# Patient Record
Sex: Female | Born: 1990 | Race: White | Hispanic: Yes | Smoking: Never smoker
Health system: Southern US, Community
[De-identification: ages and names within clinical notes are randomized; demographics above are authoritative.]

## PROBLEM LIST (undated history)

## (undated) HISTORY — PX: DILATION AND CURETTAGE OF UTERUS: SHX78

---

## 2019-09-10 DIAGNOSIS — Z419 Encounter for procedure for purposes other than remedying health state, unspecified: Secondary | ICD-10-CM | POA: Diagnosis not present

## 2019-10-10 DIAGNOSIS — Z419 Encounter for procedure for purposes other than remedying health state, unspecified: Secondary | ICD-10-CM | POA: Diagnosis not present

## 2019-10-14 DIAGNOSIS — Z3A09 9 weeks gestation of pregnancy: Secondary | ICD-10-CM | POA: Diagnosis not present

## 2019-10-14 DIAGNOSIS — A749 Chlamydial infection, unspecified: Secondary | ICD-10-CM | POA: Diagnosis not present

## 2019-10-14 DIAGNOSIS — O98811 Other maternal infectious and parasitic diseases complicating pregnancy, first trimester: Secondary | ICD-10-CM | POA: Diagnosis not present

## 2019-10-14 DIAGNOSIS — Z3689 Encounter for other specified antenatal screening: Secondary | ICD-10-CM | POA: Diagnosis not present

## 2019-10-14 DIAGNOSIS — Z3481 Encounter for supervision of other normal pregnancy, first trimester: Secondary | ICD-10-CM | POA: Diagnosis not present

## 2019-10-14 DIAGNOSIS — Z3491 Encounter for supervision of normal pregnancy, unspecified, first trimester: Secondary | ICD-10-CM | POA: Diagnosis not present

## 2019-10-22 DIAGNOSIS — Z3A08 8 weeks gestation of pregnancy: Secondary | ICD-10-CM | POA: Diagnosis not present

## 2019-10-22 DIAGNOSIS — O3680X0 Pregnancy with inconclusive fetal viability, not applicable or unspecified: Secondary | ICD-10-CM | POA: Diagnosis not present

## 2019-10-22 DIAGNOSIS — O021 Missed abortion: Secondary | ICD-10-CM | POA: Diagnosis not present

## 2019-10-24 DIAGNOSIS — O021 Missed abortion: Secondary | ICD-10-CM | POA: Diagnosis not present

## 2019-10-24 DIAGNOSIS — Z3A08 8 weeks gestation of pregnancy: Secondary | ICD-10-CM | POA: Diagnosis not present

## 2019-10-24 DIAGNOSIS — N854 Malposition of uterus: Secondary | ICD-10-CM | POA: Diagnosis not present

## 2019-11-10 DIAGNOSIS — Z419 Encounter for procedure for purposes other than remedying health state, unspecified: Secondary | ICD-10-CM | POA: Diagnosis not present

## 2019-12-10 DIAGNOSIS — Z419 Encounter for procedure for purposes other than remedying health state, unspecified: Secondary | ICD-10-CM | POA: Diagnosis not present

## 2020-01-07 DIAGNOSIS — Z3A00 Weeks of gestation of pregnancy not specified: Secondary | ICD-10-CM | POA: Diagnosis not present

## 2020-01-07 DIAGNOSIS — O200 Threatened abortion: Secondary | ICD-10-CM | POA: Diagnosis not present

## 2020-01-10 DIAGNOSIS — Z419 Encounter for procedure for purposes other than remedying health state, unspecified: Secondary | ICD-10-CM | POA: Diagnosis not present

## 2020-02-10 DIAGNOSIS — Z419 Encounter for procedure for purposes other than remedying health state, unspecified: Secondary | ICD-10-CM | POA: Diagnosis not present

## 2020-03-09 DIAGNOSIS — Z419 Encounter for procedure for purposes other than remedying health state, unspecified: Secondary | ICD-10-CM | POA: Diagnosis not present

## 2020-04-09 DIAGNOSIS — Z419 Encounter for procedure for purposes other than remedying health state, unspecified: Secondary | ICD-10-CM | POA: Diagnosis not present

## 2020-04-24 ENCOUNTER — Other Ambulatory Visit: Payer: Self-pay

## 2020-04-24 ENCOUNTER — Encounter (HOSPITAL_COMMUNITY): Payer: Self-pay | Admitting: Emergency Medicine

## 2020-04-24 ENCOUNTER — Inpatient Hospital Stay (HOSPITAL_COMMUNITY): Payer: Medicaid Other

## 2020-04-24 ENCOUNTER — Inpatient Hospital Stay (HOSPITAL_COMMUNITY)
Admission: EM | Admit: 2020-04-24 | Discharge: 2020-04-24 | Disposition: A | Discharge status: Home / Self Care | Payer: Medicaid Other | Attending: Obstetrics & Gynecology | Admitting: Obstetrics & Gynecology

## 2020-04-24 DIAGNOSIS — K5900 Constipation, unspecified: Secondary | ICD-10-CM | POA: Insufficient documentation

## 2020-04-24 DIAGNOSIS — R1011 Right upper quadrant pain: Secondary | ICD-10-CM | POA: Insufficient documentation

## 2020-04-24 DIAGNOSIS — O99611 Diseases of the digestive system complicating pregnancy, first trimester: Secondary | ICD-10-CM | POA: Insufficient documentation

## 2020-04-24 DIAGNOSIS — O26891 Other specified pregnancy related conditions, first trimester: Secondary | ICD-10-CM | POA: Insufficient documentation

## 2020-04-24 DIAGNOSIS — Z3A14 14 weeks gestation of pregnancy: Secondary | ICD-10-CM | POA: Diagnosis not present

## 2020-04-24 DIAGNOSIS — O99612 Diseases of the digestive system complicating pregnancy, second trimester: Secondary | ICD-10-CM | POA: Diagnosis not present

## 2020-04-24 DIAGNOSIS — O26892 Other specified pregnancy related conditions, second trimester: Secondary | ICD-10-CM

## 2020-04-24 DIAGNOSIS — O26899 Other specified pregnancy related conditions, unspecified trimester: Secondary | ICD-10-CM

## 2020-04-24 DIAGNOSIS — R109 Unspecified abdominal pain: Secondary | ICD-10-CM

## 2020-04-24 DIAGNOSIS — Z3A13 13 weeks gestation of pregnancy: Secondary | ICD-10-CM | POA: Insufficient documentation

## 2020-04-24 LAB — URINALYSIS, ROUTINE W REFLEX MICROSCOPIC
Bilirubin Urine: NEGATIVE
Glucose, UA: NEGATIVE mg/dL
Hgb urine dipstick: NEGATIVE
Ketones, ur: NEGATIVE mg/dL
Nitrite: NEGATIVE
Protein, ur: NEGATIVE mg/dL
Specific Gravity, Urine: 1.016 (ref 1.005–1.030)
pH: 5 (ref 5.0–8.0)

## 2020-04-24 LAB — CBC WITH DIFFERENTIAL/PLATELET
Abs Immature Granulocytes: 0.02 10*3/uL (ref 0.00–0.07)
Basophils Absolute: 0 10*3/uL (ref 0.0–0.1)
Basophils Relative: 0 %
Eosinophils Absolute: 0.1 10*3/uL (ref 0.0–0.5)
Eosinophils Relative: 2 %
HCT: 36.3 % (ref 36.0–46.0)
Hemoglobin: 12 g/dL (ref 12.0–15.0)
Immature Granulocytes: 0 %
Lymphocytes Relative: 29 %
Lymphs Abs: 1.8 10*3/uL (ref 0.7–4.0)
MCH: 30.1 pg (ref 26.0–34.0)
MCHC: 33.1 g/dL (ref 30.0–36.0)
MCV: 91 fL (ref 80.0–100.0)
Monocytes Absolute: 0.3 10*3/uL (ref 0.1–1.0)
Monocytes Relative: 5 %
Neutro Abs: 3.9 10*3/uL (ref 1.7–7.7)
Neutrophils Relative %: 64 %
Platelets: 215 10*3/uL (ref 150–400)
RBC: 3.99 MIL/uL (ref 3.87–5.11)
RDW: 13.7 % (ref 11.5–15.5)
WBC: 6.2 10*3/uL (ref 4.0–10.5)
nRBC: 0 % (ref 0.0–0.2)

## 2020-04-24 LAB — WET PREP, GENITAL
Clue Cells Wet Prep HPF POC: NONE SEEN
Sperm: NONE SEEN
Trich, Wet Prep: NONE SEEN
Yeast Wet Prep HPF POC: NONE SEEN

## 2020-04-24 LAB — HCG, QUANTITATIVE, PREGNANCY: hCG, Beta Chain, Quant, S: 52816 m[IU]/mL — ABNORMAL HIGH (ref <5)

## 2020-04-24 LAB — COMPREHENSIVE METABOLIC PANEL
ALT: 12 U/L (ref 0–44)
AST: 17 U/L (ref 15–41)
Albumin: 3.1 g/dL — ABNORMAL LOW (ref 3.5–5.0)
Alkaline Phosphatase: 38 U/L (ref 38–126)
Anion gap: 12 (ref 5–15)
BUN: 6 mg/dL (ref 6–20)
CO2: 20 mmol/L — ABNORMAL LOW (ref 22–32)
Calcium: 9.4 mg/dL (ref 8.9–10.3)
Chloride: 104 mmol/L (ref 98–111)
Creatinine, Ser: 0.62 mg/dL (ref 0.44–1.00)
GFR, Estimated: >60 mL/min (ref >60)
Glucose, Bld: 102 mg/dL — ABNORMAL HIGH (ref 70–99)
Potassium: 3.6 mmol/L (ref 3.5–5.1)
Sodium: 136 mmol/L (ref 135–145)
Total Bilirubin: 0.2 mg/dL — ABNORMAL LOW (ref 0.3–1.2)
Total Protein: 7.2 g/dL (ref 6.5–8.1)

## 2020-04-24 LAB — HCG, SERUM, QUALITATIVE: Preg, Serum: POSITIVE — AB

## 2020-04-24 LAB — LIPASE, BLOOD: Lipase: 38 U/L (ref 11–51)

## 2020-04-24 MED ORDER — DOCUSATE SODIUM 100 MG PO CAPS: 100.0000 mg | ORAL_CAPSULE | Freq: Two times a day (BID) | ORAL | 0 refills | Status: AC

## 2020-04-24 MED ORDER — CYCLOBENZAPRINE HCL 5 MG PO TABS
10.0000 mg | ORAL_TABLET | Freq: Once | ORAL | Status: AC
  Administered 2020-04-24: 10 mg via ORAL
  Filled 2020-04-24: qty 2

## 2020-04-24 MED ORDER — ACETAMINOPHEN 500 MG PO TABS
1000.0000 mg | ORAL_TABLET | Freq: Once | ORAL | Status: AC
  Administered 2020-04-24: 1000 mg via ORAL
  Filled 2020-04-24: qty 2

## 2020-04-24 MED ORDER — POLYETHYLENE GLYCOL 3350 17 G PO PACK: 17.0000 g | PACK | Freq: Every day | ORAL | 0 refills | Status: AC

## 2020-04-24 NOTE — ED Triage Notes (Signed)
Patient reports RUQ abdominal pain last night , she is [redacted] weeks pregnant G3P2 , no prenatal care , denies vaginal bleeding .

## 2020-04-24 NOTE — MAU Provider Note (Signed)
History     CSN: [id number]  Arrival date and time: 04/24/20 0234   Event Date/Time   First Provider Initiated Contact with Patient 04/24/20 [phone]      Chief Complaint  Patient presents with  . RUQ Abdominal Pain / 14 weeks G3P2  . Abdominal Pain   Joanna Pratt is a 30 y.o. [id number] at [redacted]w[redacted]d who receives care in [place].  She presents today for Abdominal Pain.  She states her pain started Thursday and "is uncomfortable."  Patient states she can not recall the last time she had a bowel movement.  She reports she takes Zofran every 8 hours for nausea and last dose was "yesterday around 6pm." She states the pain is "on and off" and has no relieving factors, but is worsened "when I go to the bathroom and can not poop." She rates the pain a 5/10 and has taken tylenol with some relief around 9pm.     OB History    Gravida  4   Para  2   Term  1   Preterm  1   AB  1   Living  2     SAB  1   IAB      Ectopic      Multiple      Live Births  2           History reviewed. No pertinent past medical history.  Past Surgical History:  Procedure Laterality Date  . DILATION AND CURETTAGE OF UTERUS      History reviewed. No pertinent family history.  Social History   Tobacco Use  . Smoking status: Never Smoker  . Smokeless tobacco: Never Used  Substance Use Topics  . Alcohol use: Never  . Drug use: Never    Allergies: No Known Allergies  Medications Prior to Admission  Medication Sig Dispense Refill Last Dose  . ondansetron (ZOFRAN) 4 MG tablet Take 4 mg by mouth every 8 (eight) hours as needed for nausea or vomiting.   04/23/2020 at Unknown time    Review of Systems  Constitutional: Negative for chills and fever.  Eyes: Negative for visual disturbance.  Respiratory: Negative for cough and shortness of breath.   Gastrointestinal: Positive for abdominal pain, constipation and nausea. Negative for vomiting.  Genitourinary: Negative for difficulty  urinating, dysuria, vaginal bleeding and vaginal discharge.  Musculoskeletal: Negative for back pain.  Neurological: Negative for dizziness, light-headedness and headaches.   Physical Exam   Blood pressure 105/73, pulse 91, temperature 98.5 F (36.9 C), temperature source Oral, resp. rate 18, height 5\' 4"  (1.626 m), weight 77.2 kg, last menstrual period 01/11/2020, SpO2 100 %.  Physical Exam Vitals reviewed.  Constitutional:      Appearance: She is well-developed.  HENT:     Head: Normocephalic and atraumatic.  Eyes:     Conjunctiva/sclera: Conjunctivae normal.  Cardiovascular:     Rate and Rhythm: Normal rate and regular rhythm.     Heart sounds: Normal heart sounds.  Pulmonary:     Effort: Pulmonary effort is normal. No respiratory distress.     Breath sounds: Normal breath sounds.  Abdominal:     Palpations: Abdomen is soft.  Musculoskeletal:        General: Normal range of motion.     Cervical back: Normal range of motion.     Right lower leg: No edema.     Left lower leg: No edema.  Skin:    General: Skin is warm and dry.  Neurological:     Mental Status: She is alert and oriented to person, place, and time.  Psychiatric:        Mood and Affect: Mood normal.        Behavior: Behavior normal.     MAU Course  Procedures   Results for orders placed or performed during the hospital encounter of 04/24/20 (from the past 24 hour(s))  hCG, quantitative, pregnancy     Status: Abnormal   Collection Time: 04/24/20  3:30 AM  Result Value Ref Range   hCG, Beta Chain, Quant, S 52,816 (H) <5 mIU/mL  CBC with Differential/Platelet     Status: None   Collection Time: 04/24/20  3:38 AM  Result Value Ref Range   WBC 6.2 4.0 - 10.5 K/uL   RBC 3.99 3.87 - 5.11 MIL/uL   Hemoglobin 12.0 12.0 - 15.0 g/dL   HCT 60.4 54.0 - 98.1 %   MCV 91.0 80.0 - 100.0 fL   MCH 30.1 26.0 - 34.0 pg   MCHC 33.1 30.0 - 36.0 g/dL   RDW 19.1 47.8 - 29.5 %   Platelets 215 150 - 400 K/uL   nRBC 0.0  0.0 - 0.2 %   Neutrophils Relative % 64 %   Neutro Abs 3.9 1.7 - 7.7 K/uL   Lymphocytes Relative 29 %   Lymphs Abs 1.8 0.7 - 4.0 K/uL   Monocytes Relative 5 %   Monocytes Absolute 0.3 0.1 - 1.0 K/uL   Eosinophils Relative 2 %   Eosinophils Absolute 0.1 0.0 - 0.5 K/uL   Basophils Relative 0 %   Basophils Absolute 0.0 0.0 - 0.1 K/uL   Immature Granulocytes 0 %   Abs Immature Granulocytes 0.02 0.00 - 0.07 K/uL  Comprehensive metabolic panel     Status: Abnormal   Collection Time: 04/24/20  3:38 AM  Result Value Ref Range   Sodium 136 135 - 145 mmol/L   Potassium 3.6 3.5 - 5.1 mmol/L   Chloride 104 98 - 111 mmol/L   CO2 20 (L) 22 - 32 mmol/L   Glucose, Bld 102 (H) 70 - 99 mg/dL   BUN 6 6 - 20 mg/dL   Creatinine, Ser 6.21 0.44 - 1.00 mg/dL   Calcium 9.4 8.9 - 30.8 mg/dL   Total Protein 7.2 6.5 - 8.1 g/dL   Albumin 3.1 (L) 3.5 - 5.0 g/dL   AST 17 15 - 41 U/L   ALT 12 0 - 44 U/L   Alkaline Phosphatase 38 38 - 126 U/L   Total Bilirubin 0.2 (L) 0.3 - 1.2 mg/dL   GFR, Estimated >65 >78 mL/min   Anion gap 12 5 - 15  Lipase, blood     Status: None   Collection Time: 04/24/20  3:38 AM  Result Value Ref Range   Lipase 38 11 - 51 U/L  hCG, serum, qualitative     Status: Abnormal   Collection Time: 04/24/20  4:20 AM  Result Value Ref Range   Preg, Serum POSITIVE (A) NEGATIVE  Urinalysis, Routine w reflex microscopic Urine, Clean Catch     Status: Abnormal   Collection Time: 04/24/20  5:21 AM  Result Value Ref Range   Color, Urine YELLOW YELLOW   APPearance HAZY (A) CLEAR   Specific Gravity, Urine 1.016 1.005 - 1.030   pH 5.0 5.0 - 8.0   Glucose, UA NEGATIVE NEGATIVE mg/dL   Hgb urine dipstick NEGATIVE NEGATIVE   Bilirubin Urine NEGATIVE NEGATIVE   Ketones, ur NEGATIVE NEGATIVE mg/dL  Protein, ur NEGATIVE NEGATIVE mg/dL   Nitrite NEGATIVE NEGATIVE   Leukocytes,Ua TRACE (A) NEGATIVE   RBC / HPF 0-5 0 - 5 RBC/hpf   WBC, UA 11-20 0 - 5 WBC/hpf   Bacteria, UA FEW (A) NONE SEEN    Squamous Epithelial / LPF 0-5 0 - 5   Mucus PRESENT    US OB Comp Less 14 Wks  Result Date: 04/24/2020 CLINICAL DATA:  Abdominal pain for 2 days EXAM: OBSTETRIC <14 WK ULTRASOUND TECHNIQUE: Transabdominal ultrasound was performed for evaluation of the gestation as well as the maternal uterus and adnexal regions. COMPARISON:  None. FINDINGS: Intrauterine gestational sac: Single Yolk sac:  Not Visualized. Embryo:  Visualized. Cardiac Activity: Visualized. Heart Rate: 167 bpm CRL:   72.9 mm   13 w 3 d                  [place] EDC: 10/27/2020 Subchorionic hemorrhage:  None visualized. Maternal uterus/adnexae: Gravid maternal uterus without acute or worrisome abnormality. Normal appearance of the ovaries. No free pelvic fluid. IMPRESSION: Single viable intrauterine gestation at 13 weeks, 3 days by crown-rump length sonographic estimation. No acute or worrisome sonographic complication. Nonvisualization of the yolk sac is a normal physiologic finding after 10 weeks. Electronically Signed   By: Kreg Shropshire M.D.   On: 04/24/2020 06:43    MDM Labs: UA, Wet Prep, UC Results of CBC, CMP, and hCG reviewed from ED blood draw Ultrasound Analgesic Muscle Relaxant Assessment and Plan  30 year old  [id number] at 14.6 weeks Abdominal Pain Constipation  -POC Reviewed. -Patient declines GC/CT testing stating she had one recently at her primary ob.  Patient is agreeable to wet prep. -Patient also declines Enema stating she has never had one before. -Provider reiterates benefits of enema and patient declines stating she would rather do home treatment.  -Will send for [place] to confirm SIUP. -Discussed management of constipation at home with increased water and fiber intake as well as stool softener/laxative.  -Instructed to decrease usage of Zofran to reduce constipation.  -Patient offered and accepts pain medication. -Will give flexeril and muscle relaxant. -Will await results.   Cherre Robins 04/24/2020, 5:49 AM    Reassessment (6:49 AM) SIUP at 13.6 weeks  -Wet prep returns negative -[place] results as above. -Provider to bedside to discuss. -Informed that will not change EDD. -Discussed usage of laxative and stool softener. -Rx for miralax and colace given. -Patient reports pain has improved -Patient to follow up with primary ob as scheduled. -Encouraged to call primary ob or return to MAU if symptoms worsen or with the onset of new symptoms. -Discharged to home in stable condition.  Cherre Robins MSN, CNM Advanced Practice Provider, [facility]

## 2020-04-24 NOTE — Discharge Instructions (Signed)
Fiber Content in Foods Fiber is a substance that is found in plant foods, such as fruits, vegetables, whole grains, nuts, seeds, and beans. As part of your treatment and recovery plan, your health care provider may recommend that you eat foods that have specific amounts of dietary fiber. Some conditions may require a high-fiber diet while others may require a low-fiber diet. This sheet gives you information about the dietary fiber content of some common foods. Your health care provider will tell you how much fiber you need in your diet. If you have problems or questions, contact your health care provider or dietitian. What foods are high in fiber? Fruits  Blackberries or raspberries (fresh) --  cup (75 g) has 4 g of fiber.  Pear (fresh) -- 1 medium (180 g) has 5.5 g of fiber.  Prunes (dried) -- 6 to 8 pieces (57-76 g) has 5 g of fiber.  Apple with skin -- 1 medium (182 g) has 4.8 g of fiber.  Guava -- 1 cup (128 g) has 8.9 g of fiber. Vegetables  Peas (frozen) --  cup (80 g) has 4.4 g of fiber.  Potato with skin (baked) -- 1 medium (173 g) has 4.4 g of fiber.  Pumpkin (canned) --  cup (122 g) has 5 g of fiber.  Brussels sprouts (cooked) --  cup (78 g) has 4 g of fiber.  Sweet potato --  cup mashed (124 g) has 4 g of fiber.  Winter squash -- 1 cup cooked (205 g) has 5.7 g of fiber. Grains  Bran cereal --  cup (31 g) has 8.6 g of fiber.  Bulgur (cooked) --  cup (70 g) has 4 g of fiber.  Quinoa (cooked) -- 1 cup (185 g) has 5.2 g of fiber.  Popcorn -- 3 cups (375 g) popped has 5.8 g of fiber.  Spaghetti, whole wheat -- 1 cup (140 g) has 6 g of fiber. Meats and other proteins  Pinto beans (cooked) --  cup (90 g) has 7.7 g of fiber.  Lentils (cooked) --  cup (90 g) has 7.8 g of fiber.  Kidney beans (canned) --  cup (92.5 g) has 5.7 g of fiber.  Soybeans (canned, frozen, or fresh) --  cup (92.5 g) has 5.2 g of fiber.  Baked beans, plain or vegetarian (canned) --   cup (130 g) has 5.2 g of fiber.  Garbanzo beans or chickpeas (canned) --  cup (90 g) has 6.6 g of fiber.  Black beans (cooked) --  cup (86 g) has 7.5 g of fiber.  White beans or navy beans (cooked) --  cup (91 g) has 9.3 g of fiber. The items listed above may not be a complete list of foods with high fiber. Actual amounts of fiber may be different depending on processing. Contact a dietitian for more information.   What foods are moderate in fiber? Fruits  Banana -- 1 medium (126 g) has 3.2 g of fiber.  Melon -- 1 cup (155 g) has 1.4 g of fiber.  Orange -- 1 small (154 g) has 3.7 g of fiber.  Raisins --  cup (40 g) has 1.8 g of fiber.  Applesauce, sweetened --  cup (125 g) has 1.5 g of fiber.  Blueberries (fresh) --  cup (75 g) has 1.8 g of fiber.  Strawberries (fresh, sliced) -- 1 cup (150 g) has 3 g of fiber.  Cherries -- 1 cup (140 g) has 2.9 g of fiber. Vegetables    Broccoli (cooked) --  cup (77.5 g) has 2.1 g of fiber.  Carrots (cooked) --  cup (77.5 g) has 2.2 g of fiber.  Corn (canned or frozen) --  cup (82.5 g) has 2.1 g of fiber.  Potatoes, mashed --  cup (105 g) has 1.6 g of fiber.  Tomato -- 1 medium (62 g) has 1.5 g of fiber.  Green beans (canned) --  cup (83 g) has 2 g of fiber.  Squash, winter --  cup (58 g) has 1 g of fiber.  Sweet potato, baked -- 1 medium (150 g) has 3 g of fiber.  Cauliflower (cooked) -- 1/2 cup (90 g) has 2.3 g of fiber. Grains  Long-grain brown rice (cooked) -- 1 cup (196 g) has 3.5 g of fiber.  Bagel, plain -- one 4-inch (10 cm) bagel has 2 g of fiber.  Instant oatmeal --  cup (120 g) has about 2 g of fiber.  Macaroni noodles, enriched (cooked) -- 1 cup (140 g) has 2.5 g of fiber.  Multigrain cereal --  cup (15 g) has about 2-4 g of fiber.  Whole-wheat bread -- 1 slice (26 g) has 2 g of fiber.  Whole-wheat spaghetti noodles --  cup (70 g) has 3.2 g of fiber.  Corn tortilla -- one 6-inch (15 cm) tortilla  has 1.5 g of fiber. Meats and other proteins  Almonds --  cup or 1 oz (28 g) has 3.5 g of fiber.  Sunflower seeds in shell --  cup or  oz (11.5 g) has 1.1 g of fiber.  Vegetable or soy patty -- 1 patty (70 g) has 3.4 g of fiber.  Walnuts --  cup or 1 oz (30 g) has 2 g of fiber.  Flax seed -- 1 Tbsp (7 g) has 2.8 g of fiber. The items listed above may not be a complete list of foods that have moderate amounts of fiber. Actual amounts of fiber may be different depending on processing. Contact a dietitian for more information.   What foods are low in fiber? Low-fiber foods contain less than 1 g of fiber per serving. They include: Fruits  Fruit juice --  cup or 4 fl oz (118 mL) has 0.5 g of fiber. Vegetables  Lettuce -- 1 cup (35 g) has 0.5 g of fiber.  Cucumber (slices) --  cup (60 g) has 0.3 g of fiber.  Celery -- 1 stalk (40 g) has 0.1 g of fiber. Grains  Flour tortilla -- one 6-inch (15 cm) tortilla has 0.5 g of fiber.  White rice (cooked) --  cup (81.5 g) has 0.3 g of fiber. Meats and other proteins  Egg -- 1 large (50 g) has 0 g of fiber.  Meat, poultry, or fish -- 3 oz (85 g) has 0 g of fiber. Dairy  Milk -- 1 cup or 8 fl oz (237 mL) has 0 g of fiber.  Yogurt -- 1 cup (245 g) has 0 g of fiber. The items listed above may not be a complete list of foods that are low in fiber. Actual amounts of fiber may be different depending on processing. Contact a dietitian for more information.   Summary  Fiber is a substance that is found in plant foods, such as fruits, vegetables, whole grains, nuts, seeds, and beans.  As part of your treatment and recovery plan, your health care provider may recommend that you eat foods that have specific amounts of dietary fiber. This information is   not intended to replace advice given to you by your health care provider. Make sure you discuss any questions you have with your health care provider. Document Revised: 05/01/2019 Document  Reviewed: 05/01/2019 Elsevier Patient Education  2021 Elsevier Inc. Constipation, Adult Constipation is when a person has fewer than three bowel movements in a week, has difficulty having a bowel movement, or has stools (feces) that are dry, hard, or larger than normal. Constipation may be caused by an underlying condition. It may become worse with age if a person takes certain medicines and does not take in enough fluids. Follow these instructions at home: Eating and drinking  Eat foods that have a lot of fiber, such as beans, whole grains, and fresh fruits and vegetables.  Limit foods that are low in fiber and high in fat and processed sugars, such as fried or sweet foods. These include french fries, hamburgers, cookies, candies, and soda.  Drink enough fluid to keep your urine pale yellow.   General instructions  Exercise regularly or as told by your health care provider. Try to do 150 minutes of moderate exercise each week.  Use the bathroom when you have the urge to go. Do not hold it in.  Take over-the-counter and prescription medicines only as told by your health care provider. This includes any fiber supplements.  During bowel movements: ? Practice deep breathing while relaxing the lower abdomen. ? Practice pelvic floor relaxation.  Watch your condition for any changes. Let your health care provider know about them.  Keep all follow-up visits as told by your health care provider. This is important. Contact a health care provider if:  You have pain that gets worse.  You have a fever.  You do not have a bowel movement after 4 days.  You vomit.  You are not hungry or you lose weight.  You are bleeding from the opening between the buttocks (anus).  You have thin, pencil-like stools. Get help right away if:  You have a fever and your symptoms suddenly get worse.  You leak stool or have blood in your stool.  Your abdomen is bloated.  You have severe pain in your  abdomen.  You feel dizzy or you faint. Summary  Constipation is when a person has fewer than three bowel movements in a week, has difficulty having a bowel movement, or has stools (feces) that are dry, hard, or larger than normal.  Eat foods that have a lot of fiber, such as beans, whole grains, and fresh fruits and vegetables.  Drink enough fluid to keep your urine pale yellow.  Take over-the-counter and prescription medicines only as told by your health care provider. This includes any fiber supplements. This information is not intended to replace advice given to you by your health care provider. Make sure you discuss any questions you have with your health care provider. Document Revised: 11/13/2018 Document Reviewed: 11/13/2018 Elsevier Patient Education  2021 [employer].

## 2020-04-24 NOTE — ED Provider Notes (Signed)
[name] EMERGENCY DEPARTMENT Provider Note   CSN: [id number] Arrival date & time: 04/24/20  0234     History Chief Complaint  Patient presents with  . RUQ Abdominal Pain / 14 weeks G3P2    Joanna Pratt is a 30 y.o. female.  Patient here with abdominal pain in pregnancy.  She states that the pain started last night.  She reports that she is about [redacted] weeks along.  She hasn't had any prenatal care.  Denies any vaginal discharge or bleeding.  States that she can't feel the baby moving.  Reports some cloudy urine.  Denies any other associate symptoms.  The history is provided by the patient. No language interpreter was used.       History reviewed. No pertinent past medical history.  There are no problems to display for this patient.   History reviewed. No pertinent surgical history.   OB History    Gravida  1   Para      Term      Preterm      AB      Living        SAB      IAB      Ectopic      Multiple      Live Births              No family history on file.  Social History   Tobacco Use  . Smoking status: Never Smoker  . Smokeless tobacco: Never Used  Substance Use Topics  . Alcohol use: Never  . Drug use: Never    Home Medications Prior to Admission medications   Not on File    Allergies    Patient has no known allergies.  Review of Systems   Review of Systems  All other systems reviewed and are negative.   Physical Exam Updated Vital Signs Ht 5\' 4"  (1.626 m)   LMP 01/11/2020   Physical Exam Vitals and nursing note reviewed.  Constitutional:      General: She is not in acute distress.    Appearance: She is well-developed.  HENT:     Head: Normocephalic and atraumatic.  Eyes:     Conjunctiva/sclera: Conjunctivae normal.  Cardiovascular:     Rate and Rhythm: Normal rate.     Heart sounds: No murmur heard.   Pulmonary:     Effort: Pulmonary effort is normal. No respiratory distress.   Abdominal:     General: There is no distension.  Musculoskeletal:     Cervical back: Neck supple.     Comments: Moves all extremities  Skin:    General: Skin is warm and dry.  Neurological:     Mental Status: She is alert and oriented to person, place, and time.  Psychiatric:        Mood and Affect: Mood normal.        Behavior: Behavior normal.     ED Results / Procedures / Treatments   Labs (all labs ordered are listed, but only abnormal results are displayed) Labs Reviewed - No data to display  EKG None  Radiology No results found.  Procedures Procedures   Medications Ordered in ED Medications - No data to display  ED Course  I have reviewed the triage vital signs and the nursing notes.  Pertinent labs & imaging results that were available during my care of the patient were reviewed by me and considered in my medical decision making (see chart for  details).    MDM Rules/Calculators/A&P                          G3P2 patient with abdominal cramps and decreased fetal activity.  Denies vaginal bleeding or discharge.  Cramping started last night.  Patient discussed with MAU APP, who accepts in transfer. Final Clinical Impression(s) / ED Diagnoses Final diagnoses:  Abdominal pain during pregnancy in first trimester    Rx / DC Orders ED Discharge Orders    None       Roxy Horseman, PA-C 04/24/20 0454    Shon Baton, MD 04/25/20 0005

## 2020-04-24 NOTE — MAU Note (Signed)
PT SAYS SHE WENT TO [facility] AT 0300 FOR ABD PAIN  PAIN STARTED Thursday NIGHT  [facility] IN [place]-

## 2020-04-25 LAB — URINE CULTURE: Culture: >=100000 — AB

## 2020-04-27 ENCOUNTER — Telehealth: Payer: Self-pay | Admitting: Student

## 2020-04-27 NOTE — Telephone Encounter (Signed)
Attempted to contact patient regarding urine culture results. This is the second attempt. Voicemail box is full. She needs to be treated for UTI. No pharmacy on file.   Judeth Horn, NP

## 2020-04-28 ENCOUNTER — Telehealth: Payer: Self-pay | Admitting: Obstetrics and Gynecology

## 2020-04-28 DIAGNOSIS — O2342 Unspecified infection of urinary tract in pregnancy, second trimester: Secondary | ICD-10-CM

## 2020-04-28 MED ORDER — CEFADROXIL 500 MG PO CAPS: 500.0000 mg | ORAL_CAPSULE | Freq: Two times a day (BID) | ORAL | 0 refills | Status: AC

## 2020-04-28 NOTE — Telephone Encounter (Signed)
TC to patient, ID verified by DOB and name, notified of (+) UCx results and need for abx. Pharmacy not on file -- desires Rx to be sent to [facility] in [place], [place] -- Rx sent. Advised to take Rx completely, even if not feeling symptomatic. Patient verbalized an understanding of the plan of care and agrees.   Raelyn Mora, CNM

## 2020-05-09 DIAGNOSIS — Z419 Encounter for procedure for purposes other than remedying health state, unspecified: Secondary | ICD-10-CM | POA: Diagnosis not present

## 2020-06-09 DIAGNOSIS — Z419 Encounter for procedure for purposes other than remedying health state, unspecified: Secondary | ICD-10-CM | POA: Diagnosis not present

## 2020-06-17 DIAGNOSIS — Z3480 Encounter for supervision of other normal pregnancy, unspecified trimester: Secondary | ICD-10-CM | POA: Diagnosis not present

## 2020-07-01 DIAGNOSIS — Z3689 Encounter for other specified antenatal screening: Secondary | ICD-10-CM | POA: Diagnosis not present

## 2020-07-09 DIAGNOSIS — Z419 Encounter for procedure for purposes other than remedying health state, unspecified: Secondary | ICD-10-CM | POA: Diagnosis not present

## 2020-08-04 DIAGNOSIS — N760 Acute vaginitis: Secondary | ICD-10-CM | POA: Diagnosis not present

## 2020-08-06 DIAGNOSIS — Z3689 Encounter for other specified antenatal screening: Secondary | ICD-10-CM | POA: Diagnosis not present

## 2020-08-06 DIAGNOSIS — Z3A28 28 weeks gestation of pregnancy: Secondary | ICD-10-CM | POA: Diagnosis not present

## 2020-08-06 DIAGNOSIS — O23593 Infection of other part of genital tract in pregnancy, third trimester: Secondary | ICD-10-CM | POA: Diagnosis not present

## 2020-08-09 DIAGNOSIS — Z419 Encounter for procedure for purposes other than remedying health state, unspecified: Secondary | ICD-10-CM | POA: Diagnosis not present

## 2020-08-18 DIAGNOSIS — N988 Other complications associated with artificial fertilization: Secondary | ICD-10-CM | POA: Diagnosis not present

## 2020-08-18 DIAGNOSIS — Z3A30 30 weeks gestation of pregnancy: Secondary | ICD-10-CM | POA: Diagnosis not present

## 2020-08-18 DIAGNOSIS — Z369 Encounter for antenatal screening, unspecified: Secondary | ICD-10-CM | POA: Diagnosis not present

## 2020-08-18 DIAGNOSIS — Z3493 Encounter for supervision of normal pregnancy, unspecified, third trimester: Secondary | ICD-10-CM | POA: Diagnosis not present

## 2020-09-09 DIAGNOSIS — Z419 Encounter for procedure for purposes other than remedying health state, unspecified: Secondary | ICD-10-CM | POA: Diagnosis not present

## 2020-09-25 DIAGNOSIS — R42 Dizziness and giddiness: Secondary | ICD-10-CM | POA: Diagnosis not present

## 2020-09-25 DIAGNOSIS — N3000 Acute cystitis without hematuria: Secondary | ICD-10-CM | POA: Diagnosis not present

## 2020-09-25 DIAGNOSIS — Z3A36 36 weeks gestation of pregnancy: Secondary | ICD-10-CM | POA: Diagnosis not present

## 2020-09-25 DIAGNOSIS — O2313 Infections of bladder in pregnancy, third trimester: Secondary | ICD-10-CM | POA: Diagnosis not present

## 2020-09-25 DIAGNOSIS — O26893 Other specified pregnancy related conditions, third trimester: Secondary | ICD-10-CM | POA: Diagnosis not present

## 2020-09-25 DIAGNOSIS — O99353 Diseases of the nervous system complicating pregnancy, third trimester: Secondary | ICD-10-CM | POA: Diagnosis not present

## 2020-09-25 DIAGNOSIS — O2393 Unspecified genitourinary tract infection in pregnancy, third trimester: Secondary | ICD-10-CM | POA: Diagnosis not present

## 2020-09-29 DIAGNOSIS — Z3689 Encounter for other specified antenatal screening: Secondary | ICD-10-CM | POA: Diagnosis not present

## 2020-10-09 DIAGNOSIS — Z419 Encounter for procedure for purposes other than remedying health state, unspecified: Secondary | ICD-10-CM | POA: Diagnosis not present

## 2020-10-21 DIAGNOSIS — Z79899 Other long term (current) drug therapy: Secondary | ICD-10-CM | POA: Diagnosis not present

## 2020-10-21 DIAGNOSIS — Z3A39 39 weeks gestation of pregnancy: Secondary | ICD-10-CM | POA: Diagnosis not present

## 2020-10-21 DIAGNOSIS — Z9189 Other specified personal risk factors, not elsewhere classified: Secondary | ICD-10-CM | POA: Diagnosis not present

## 2020-10-21 DIAGNOSIS — O9902 Anemia complicating childbirth: Secondary | ICD-10-CM | POA: Diagnosis not present

## 2020-10-21 DIAGNOSIS — D509 Iron deficiency anemia, unspecified: Secondary | ICD-10-CM | POA: Diagnosis not present

## 2020-11-09 DIAGNOSIS — Z419 Encounter for procedure for purposes other than remedying health state, unspecified: Secondary | ICD-10-CM | POA: Diagnosis not present

## 2020-12-06 DIAGNOSIS — Z1332 Encounter for screening for maternal depression: Secondary | ICD-10-CM | POA: Diagnosis not present

## 2020-12-06 DIAGNOSIS — N760 Acute vaginitis: Secondary | ICD-10-CM | POA: Diagnosis not present

## 2020-12-09 DIAGNOSIS — Z6828 Body mass index (BMI) 28.0-28.9, adult: Secondary | ICD-10-CM | POA: Diagnosis not present

## 2020-12-09 DIAGNOSIS — E663 Overweight: Secondary | ICD-10-CM | POA: Diagnosis not present

## 2020-12-09 DIAGNOSIS — Z419 Encounter for procedure for purposes other than remedying health state, unspecified: Secondary | ICD-10-CM | POA: Diagnosis not present

## 2020-12-09 DIAGNOSIS — R5382 Chronic fatigue, unspecified: Secondary | ICD-10-CM | POA: Diagnosis not present

## 2020-12-09 DIAGNOSIS — N390 Urinary tract infection, site not specified: Secondary | ICD-10-CM | POA: Diagnosis not present

## 2021-01-09 DIAGNOSIS — Z419 Encounter for procedure for purposes other than remedying health state, unspecified: Secondary | ICD-10-CM | POA: Diagnosis not present

## 2021-01-13 DIAGNOSIS — J069 Acute upper respiratory infection, unspecified: Secondary | ICD-10-CM | POA: Diagnosis not present

## 2021-01-13 DIAGNOSIS — R070 Pain in throat: Secondary | ICD-10-CM | POA: Diagnosis not present

## 2021-02-09 DIAGNOSIS — Z419 Encounter for procedure for purposes other than remedying health state, unspecified: Secondary | ICD-10-CM | POA: Diagnosis not present

## 2021-03-09 DIAGNOSIS — Z419 Encounter for procedure for purposes other than remedying health state, unspecified: Secondary | ICD-10-CM | POA: Diagnosis not present

## 2021-04-09 DIAGNOSIS — Z419 Encounter for procedure for purposes other than remedying health state, unspecified: Secondary | ICD-10-CM | POA: Diagnosis not present

## 2021-05-09 DIAGNOSIS — Z419 Encounter for procedure for purposes other than remedying health state, unspecified: Secondary | ICD-10-CM | POA: Diagnosis not present

## 2021-06-09 DIAGNOSIS — Z419 Encounter for procedure for purposes other than remedying health state, unspecified: Secondary | ICD-10-CM | POA: Diagnosis not present

## 2021-07-09 DIAGNOSIS — Z419 Encounter for procedure for purposes other than remedying health state, unspecified: Secondary | ICD-10-CM | POA: Diagnosis not present

## 2021-08-09 DIAGNOSIS — Z419 Encounter for procedure for purposes other than remedying health state, unspecified: Secondary | ICD-10-CM | POA: Diagnosis not present

## 2021-09-09 DIAGNOSIS — Z419 Encounter for procedure for purposes other than remedying health state, unspecified: Secondary | ICD-10-CM | POA: Diagnosis not present

## 2021-09-18 DIAGNOSIS — Z789 Other specified health status: Secondary | ICD-10-CM | POA: Diagnosis not present

## 2021-09-18 DIAGNOSIS — F419 Anxiety disorder, unspecified: Secondary | ICD-10-CM | POA: Diagnosis not present

## 2021-10-09 DIAGNOSIS — Z419 Encounter for procedure for purposes other than remedying health state, unspecified: Secondary | ICD-10-CM | POA: Diagnosis not present

## 2021-11-09 DIAGNOSIS — Z419 Encounter for procedure for purposes other than remedying health state, unspecified: Secondary | ICD-10-CM | POA: Diagnosis not present

## 2021-11-16 DIAGNOSIS — O26891 Other specified pregnancy related conditions, first trimester: Secondary | ICD-10-CM | POA: Diagnosis not present

## 2021-11-16 DIAGNOSIS — Z3A09 9 weeks gestation of pregnancy: Secondary | ICD-10-CM | POA: Diagnosis not present

## 2021-11-16 DIAGNOSIS — O3680X0 Pregnancy with inconclusive fetal viability, not applicable or unspecified: Secondary | ICD-10-CM | POA: Diagnosis not present

## 2021-11-16 DIAGNOSIS — Z3481 Encounter for supervision of other normal pregnancy, first trimester: Secondary | ICD-10-CM | POA: Diagnosis not present

## 2021-11-16 DIAGNOSIS — B9689 Other specified bacterial agents as the cause of diseases classified elsewhere: Secondary | ICD-10-CM | POA: Diagnosis not present

## 2021-11-16 DIAGNOSIS — N898 Other specified noninflammatory disorders of vagina: Secondary | ICD-10-CM | POA: Diagnosis not present

## 2021-11-16 DIAGNOSIS — Z3689 Encounter for other specified antenatal screening: Secondary | ICD-10-CM | POA: Diagnosis not present

## 2021-11-16 DIAGNOSIS — R110 Nausea: Secondary | ICD-10-CM | POA: Diagnosis not present

## 2021-12-09 DIAGNOSIS — Z419 Encounter for procedure for purposes other than remedying health state, unspecified: Secondary | ICD-10-CM | POA: Diagnosis not present

## 2021-12-14 DIAGNOSIS — Z3A13 13 weeks gestation of pregnancy: Secondary | ICD-10-CM | POA: Diagnosis not present

## 2021-12-14 DIAGNOSIS — O2341 Unspecified infection of urinary tract in pregnancy, first trimester: Secondary | ICD-10-CM | POA: Diagnosis not present

## 2021-12-14 DIAGNOSIS — Z3689 Encounter for other specified antenatal screening: Secondary | ICD-10-CM | POA: Diagnosis not present

## 2021-12-14 DIAGNOSIS — O2340 Unspecified infection of urinary tract in pregnancy, unspecified trimester: Secondary | ICD-10-CM | POA: Diagnosis not present

## 2022-01-09 DIAGNOSIS — Z419 Encounter for procedure for purposes other than remedying health state, unspecified: Secondary | ICD-10-CM | POA: Diagnosis not present

## 2022-01-11 DIAGNOSIS — O021 Missed abortion: Secondary | ICD-10-CM | POA: Diagnosis not present

## 2022-01-13 DIAGNOSIS — Z91040 Latex allergy status: Secondary | ICD-10-CM | POA: Diagnosis not present

## 2022-01-13 DIAGNOSIS — O021 Missed abortion: Secondary | ICD-10-CM | POA: Diagnosis not present

## 2022-01-13 DIAGNOSIS — Z79899 Other long term (current) drug therapy: Secondary | ICD-10-CM | POA: Diagnosis not present

## 2022-01-13 DIAGNOSIS — Z8751 Personal history of pre-term labor: Secondary | ICD-10-CM | POA: Diagnosis not present

## 2022-02-09 DIAGNOSIS — Z419 Encounter for procedure for purposes other than remedying health state, unspecified: Secondary | ICD-10-CM | POA: Diagnosis not present

## 2022-03-10 DIAGNOSIS — Z419 Encounter for procedure for purposes other than remedying health state, unspecified: Secondary | ICD-10-CM | POA: Diagnosis not present

## 2022-03-20 DIAGNOSIS — Z30017 Encounter for initial prescription of implantable subdermal contraceptive: Secondary | ICD-10-CM | POA: Diagnosis not present

## 2022-04-10 DIAGNOSIS — Z419 Encounter for procedure for purposes other than remedying health state, unspecified: Secondary | ICD-10-CM | POA: Diagnosis not present

## 2022-05-09 ENCOUNTER — Telehealth: Payer: Self-pay

## 2022-05-09 NOTE — Telephone Encounter (Signed)
LVM for patient to call back. AS, CMA 

## 2022-05-10 DIAGNOSIS — Z419 Encounter for procedure for purposes other than remedying health state, unspecified: Secondary | ICD-10-CM | POA: Diagnosis not present

## 2022-06-10 DIAGNOSIS — Z419 Encounter for procedure for purposes other than remedying health state, unspecified: Secondary | ICD-10-CM | POA: Diagnosis not present

## 2022-07-10 DIAGNOSIS — Z419 Encounter for procedure for purposes other than remedying health state, unspecified: Secondary | ICD-10-CM | POA: Diagnosis not present

## 2022-08-10 DIAGNOSIS — Z419 Encounter for procedure for purposes other than remedying health state, unspecified: Secondary | ICD-10-CM | POA: Diagnosis not present

## 2022-08-25 IMAGING — US US OB COMP LESS 14 WK
1 series · 15 of 28 positions shown · non-contrast
Comparison: None.

CLINICAL DATA: Abdominal pain for 2 days

EXAM:
OBSTETRIC <14 WK ULTRASOUND
TECHNIQUE: Transabdominal ultrasound was performed for evaluation of the
gestation as well as the maternal uterus and adnexal regions.

[Series 1: us ob comp less 14 wk · 38 acquisitions, 15 frames shown]
[im 1/38]
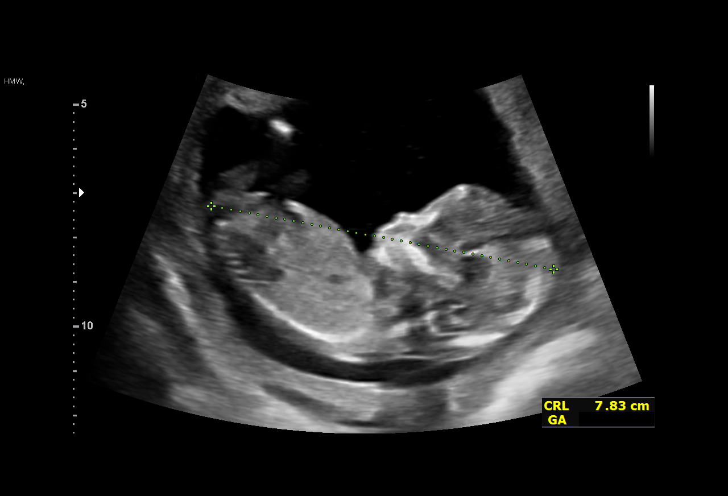
[im 3/38]
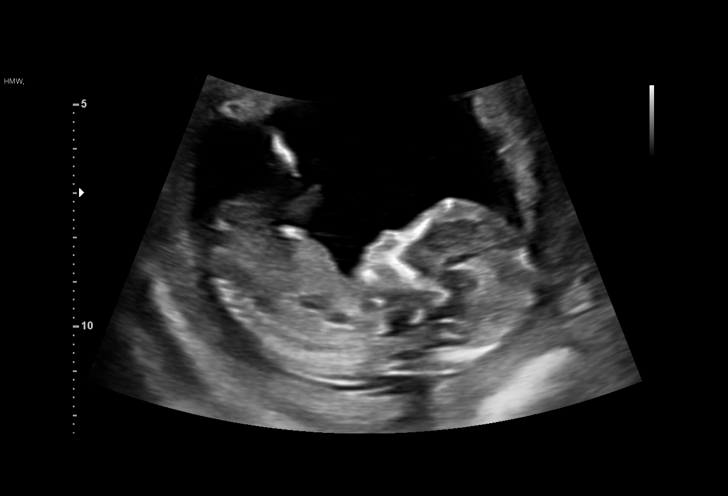
[im 6/38]
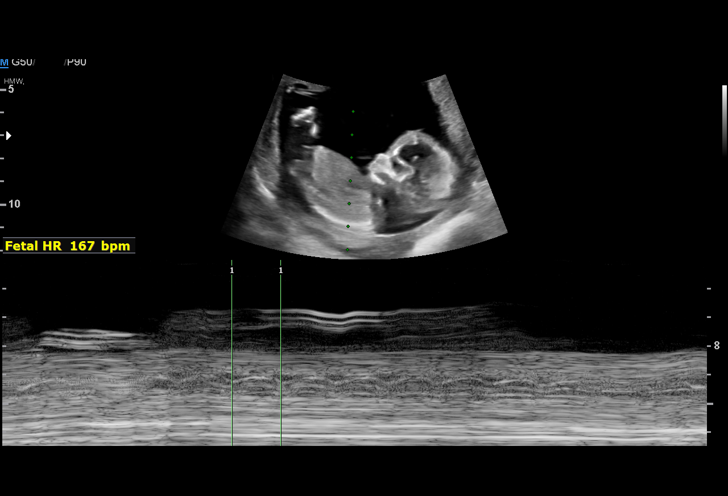
[im 9/38]
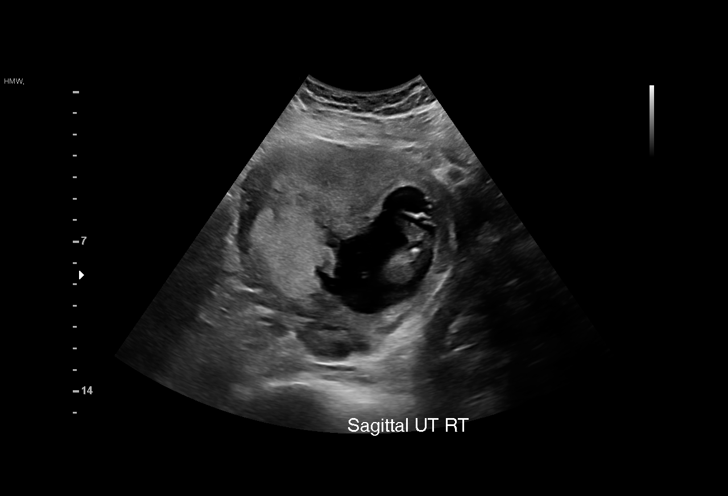
[im 11/38]
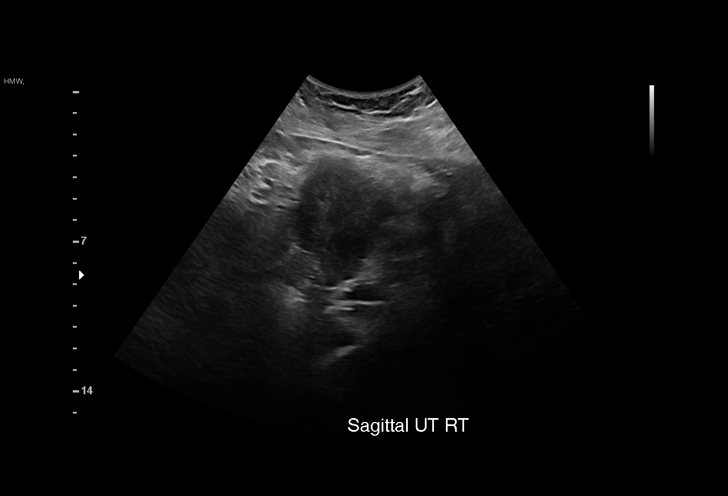
[im 14/38]
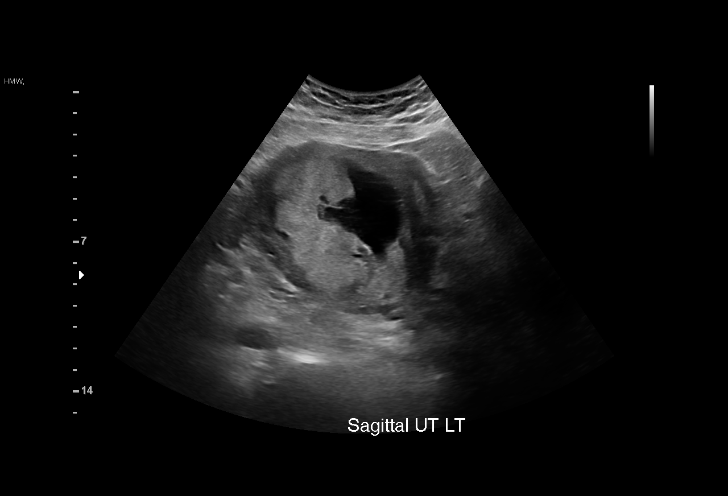
[im 17/38]
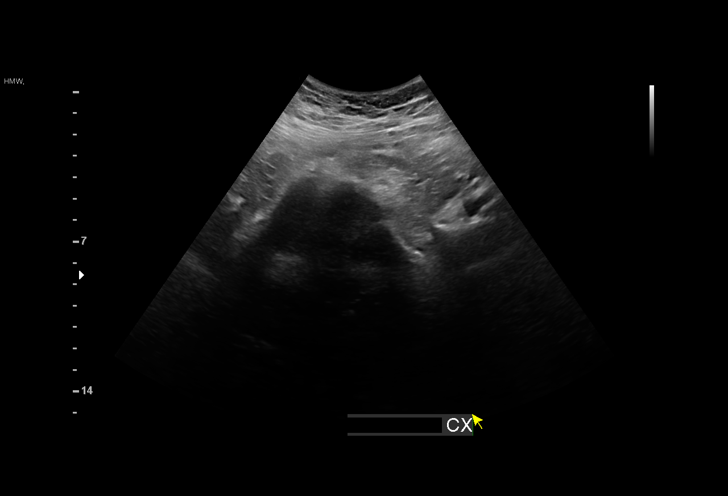
[im 20/38]
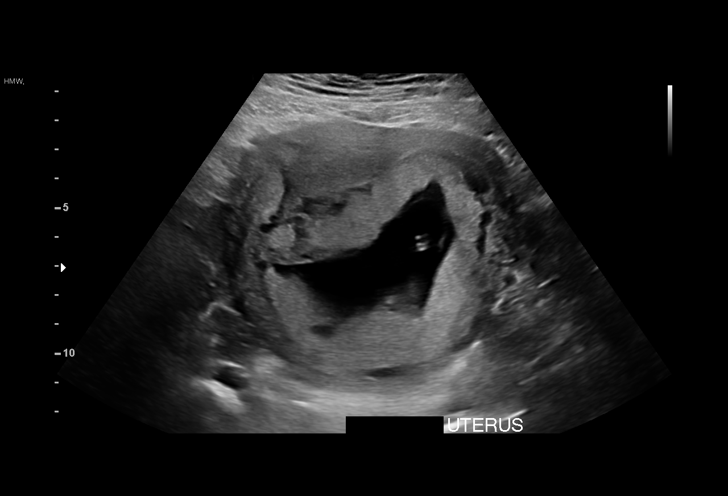
[im 21/38]
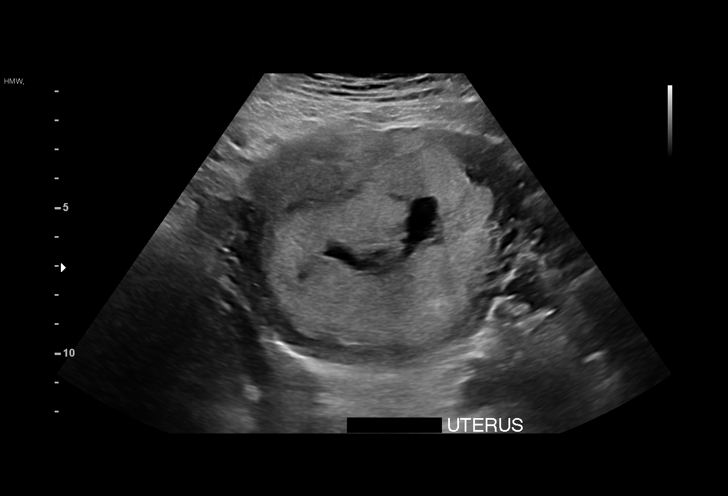
[im 24/38]
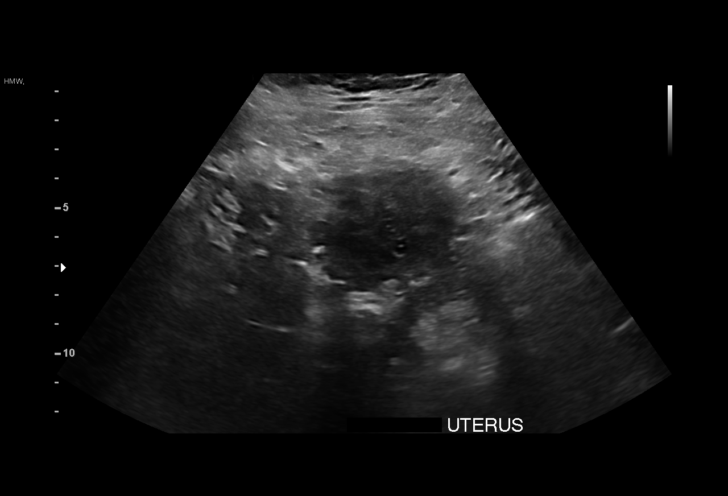
[im 27/38]
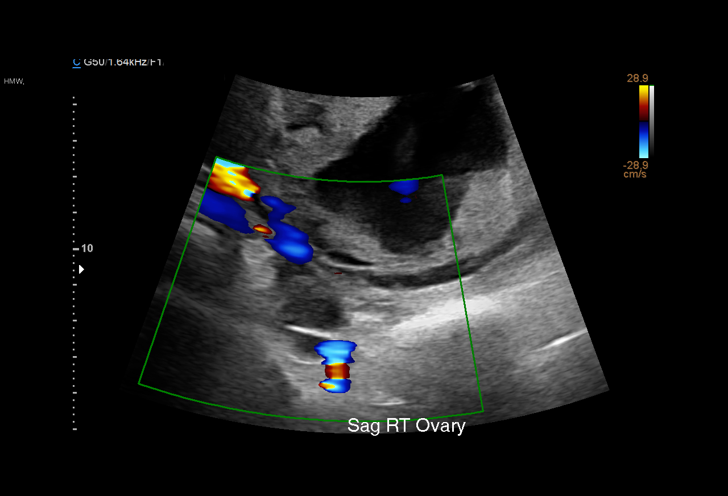
[im 29/38]
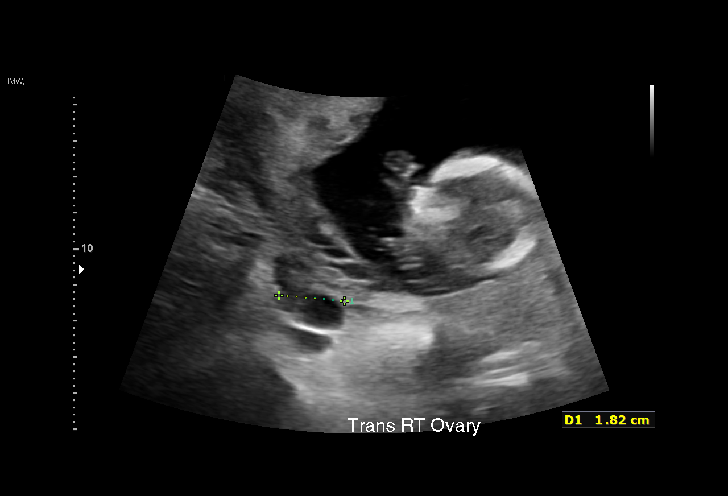
[im 32/38]
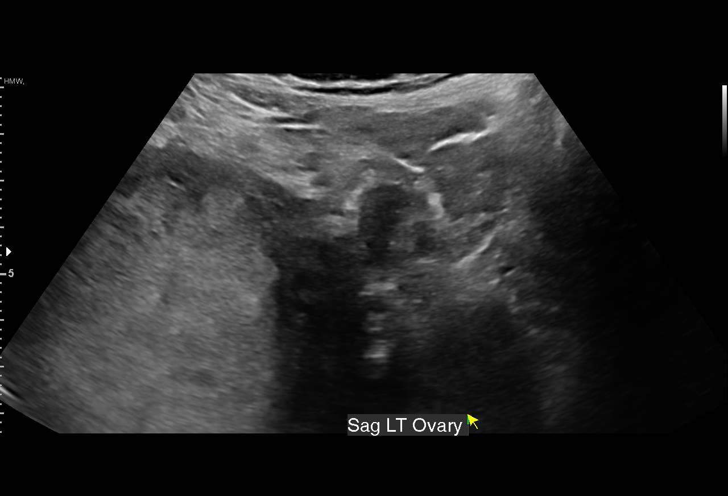
[im 35/38]
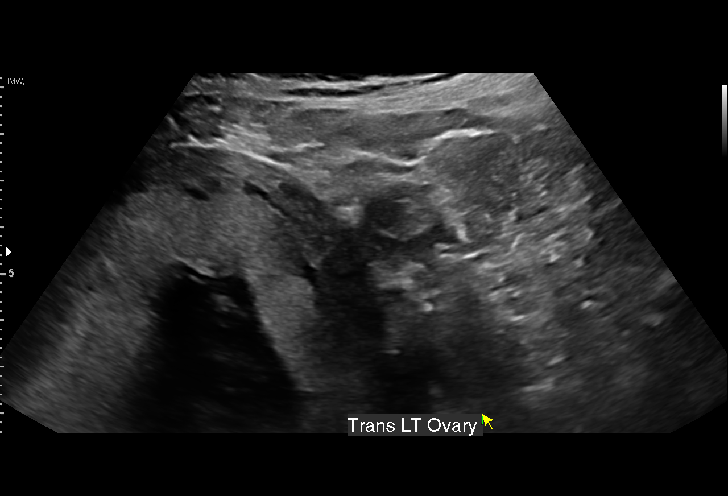
[im 38/38]
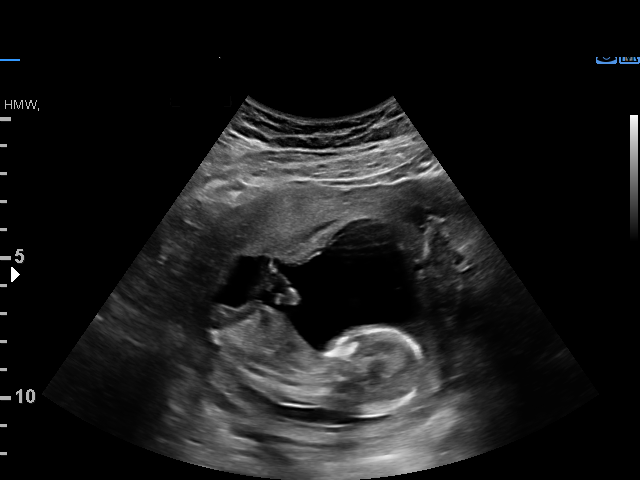

[15 of 28 positions shown; findings below may reference images not displayed]

FINDINGS: Intrauterine gestational sac: Single

Yolk sac:  Not Visualized.

Embryo:  Visualized.

Cardiac Activity: Visualized.

Heart Rate: 167 bpm

CRL:   72.9 mm   13 w 3 d                  US EDC: 10/27/2020

Subchorionic hemorrhage:  None visualized.

Maternal uterus/adnexae: Gravid maternal uterus without acute or
worrisome abnormality. Normal appearance of the ovaries. No free
pelvic fluid.
IMPRESSION: Single viable intrauterine gestation at 13 weeks, 3 days by
crown-rump length sonographic estimation.

No acute or worrisome sonographic complication.

Nonvisualization of the yolk sac is a normal physiologic finding
after 10 weeks.

## 2022-09-10 DIAGNOSIS — Z419 Encounter for procedure for purposes other than remedying health state, unspecified: Secondary | ICD-10-CM | POA: Diagnosis not present

## 2022-10-10 DIAGNOSIS — Z419 Encounter for procedure for purposes other than remedying health state, unspecified: Secondary | ICD-10-CM | POA: Diagnosis not present

## 2022-11-10 DIAGNOSIS — Z419 Encounter for procedure for purposes other than remedying health state, unspecified: Secondary | ICD-10-CM | POA: Diagnosis not present

## 2022-11-14 DIAGNOSIS — N939 Abnormal uterine and vaginal bleeding, unspecified: Secondary | ICD-10-CM | POA: Diagnosis not present

## 2022-12-10 DIAGNOSIS — Z419 Encounter for procedure for purposes other than remedying health state, unspecified: Secondary | ICD-10-CM | POA: Diagnosis not present

## 2023-01-10 DIAGNOSIS — Z419 Encounter for procedure for purposes other than remedying health state, unspecified: Secondary | ICD-10-CM | POA: Diagnosis not present

## 2023-02-10 DIAGNOSIS — Z419 Encounter for procedure for purposes other than remedying health state, unspecified: Secondary | ICD-10-CM | POA: Diagnosis not present

## 2023-03-10 DIAGNOSIS — Z419 Encounter for procedure for purposes other than remedying health state, unspecified: Secondary | ICD-10-CM | POA: Diagnosis not present

## 2023-04-21 DIAGNOSIS — Z419 Encounter for procedure for purposes other than remedying health state, unspecified: Secondary | ICD-10-CM | POA: Diagnosis not present

## 2023-05-21 DIAGNOSIS — Z419 Encounter for procedure for purposes other than remedying health state, unspecified: Secondary | ICD-10-CM | POA: Diagnosis not present

## 2023-06-21 DIAGNOSIS — Z419 Encounter for procedure for purposes other than remedying health state, unspecified: Secondary | ICD-10-CM | POA: Diagnosis not present
